# Patient Record
Sex: Male | Born: 2000
Health system: Southern US, Community
[De-identification: ages and names within clinical notes are randomized; demographics above are authoritative.]

## PROBLEM LIST (undated history)

## (undated) DIAGNOSIS — F909 Attention-deficit hyperactivity disorder, unspecified type: Secondary | ICD-10-CM

## (undated) HISTORY — PX: HERNIA REPAIR: SHX51

## (undated) HISTORY — DX: Attention-deficit hyperactivity disorder, unspecified type: F90.9

---
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
## 2021-03-23 ENCOUNTER — Encounter: Payer: Self-pay | Admitting: Emergency Medicine

## 2021-03-23 ENCOUNTER — Ambulatory Visit
Admission: EM | Admit: 2021-03-23 | Discharge: 2021-03-23 | Disposition: A | Discharge status: Home / Self Care | Payer: BC Managed Care – PPO | Attending: Emergency Medicine | Admitting: Emergency Medicine

## 2021-03-23 ENCOUNTER — Other Ambulatory Visit: Payer: Self-pay

## 2021-03-23 DIAGNOSIS — K529 Noninfective gastroenteritis and colitis, unspecified: Secondary | ICD-10-CM | POA: Diagnosis not present

## 2021-03-23 MED ORDER — DICYCLOMINE HCL 20 MG PO TABS: 20.0000 mg | ORAL_TABLET | Freq: Two times a day (BID) | ORAL | 0 refills | Status: DC

## 2021-03-23 MED ORDER — ONDANSETRON 8 MG PO TBDP: 8.0000 mg | ORAL_TABLET | Freq: Three times a day (TID) | ORAL | 0 refills | Status: DC | PRN

## 2021-03-23 NOTE — ED Triage Notes (Signed)
Pt c/o vomiting, diarrhea, and abdominal pain. Started this morning. He states he has not been feeling well all week but symptoms started this morning. He has not vomited since 12 today.

## 2021-03-23 NOTE — Discharge Instructions (Signed)
Take the Zofran every 8 hours as needed for nausea and vomiting.  They are an oral disintegrating tablet and you can place them on her under your tongue and then will be absorbed.  Use the Bentyl (dicyclomine) every 6 hours as needed for abdominal cramping.  Follow a clear liquid diet for the next 6 to 12 hours.  Clear liquids consist of broth, ginger ale, water, Pedialyte, and Jell-O.  After 6 to 12 hours, if you are tolerating clear liquids, you can advance to bland foods such as bananas, rice, applesauce, and toast.  If you tolerate bland foods you can continue to advance your diet as you see fit.  If you develop a fever over 100.5, increased abdominal pain, bloody vomit, or bloody stool return for reevaluation or go to the ER.  

## 2021-03-23 NOTE — ED Provider Notes (Signed)
[facility]    CSN: [id number] Arrival date & time: 03/23/21  1532      History   Chief Complaint Chief Complaint  Patient presents with   Emesis    HPI Eduardo Ball is a 20 y.o. male.   HPI  20 year old male here for evaluation of GI complaints.  Patient reports that he developed abdominal cramping, nausea, vomiting, and diarrhea this morning on his way to work.  He is about 5 episodes of emesis without any blood in it and 2 episodes of diarrhea, also with no blood in the stool.  He denies any fever.  Patient denies exposure to anyone with similar symptoms or eating anything that tasted bad or different.  Patient is able to keep down fluids  Past Medical History:  Diagnosis Date   ADHD (attention deficit hyperactivity disorder)     Patient Active Problem List   Diagnosis Date Noted   Undescended left testicle 04/29/2018   Attention deficit hyperactivity disorder (ADHD) 04/24/2018   Attention or concentration deficit 10/17/2017    Past Surgical History:  Procedure Laterality Date   HERNIA REPAIR         Home Medications    Prior to Admission medications   Medication Sig Start Date End Date Taking? Authorizing Provider  dicyclomine (BENTYL) 20 MG tablet Take 1 tablet (20 mg total) by mouth 2 (two) times daily. 03/23/21  Yes Becky Augusta, NP  ondansetron (ZOFRAN ODT) 8 MG disintegrating tablet Take 1 tablet (8 mg total) by mouth every 8 (eight) hours as needed for nausea or vomiting. 03/23/21  Yes Becky Augusta, NP    Family History No family history on file.  Social History Social History   Tobacco Use   Smoking status: Never   Smokeless tobacco: Former    Types: Snuff  Vaping Use   Vaping Use: Every day  Substance Use Topics   Alcohol use: Not Currently    Alcohol/week: 0.0 - 4.0 standard drinks   Drug use: No     Allergies   Patient has no known allergies.   Review of Systems Review of Systems  Constitutional:  Negative for  activity change, appetite change and fever.  Gastrointestinal:  Positive for abdominal pain, diarrhea, nausea and vomiting. Negative for blood in stool.  Skin:  Negative for rash.  Hematological: Negative.   Psychiatric/Behavioral: Negative.      Physical Exam Triage Vital Signs ED Triage Vitals  Enc Vitals Group     BP 03/23/21 1658 122/83     Pulse Rate 03/23/21 1658 83     Resp 03/23/21 1658 18     Temp 03/23/21 1658 98.6 F (37 C)     Temp Source 03/23/21 1658 Oral     SpO2 03/23/21 1658 100 %     Weight 03/23/21 1656 167 lb 15.9 oz (76.2 kg)     Height 03/23/21 1656 5' 11.75" (1.822 m)     Head Circumference --      Peak Flow --      Pain Score 03/23/21 1655 5     Pain Loc --      Pain Edu? --      Excl. in GC? --    No data found.  Updated Vital Signs BP 122/83 (BP Location: Left Arm)   Pulse 83   Temp 98.6 F (37 C) (Oral)   Resp 18   Ht 5' 11.75" (1.822 m)   Wt 167 lb 15.9 oz (76.2 kg)  SpO2 100%   BMI 22.94 kg/m   Visual Acuity Right Eye Distance:   Left Eye Distance:   Bilateral Distance:    Right Eye Near:   Left Eye Near:    Bilateral Near:     Physical Exam Vitals and nursing note reviewed.  Constitutional:      General: He is not in acute distress.    Appearance: Normal appearance. He is normal weight. He is not ill-appearing.  HENT:     Head: Normocephalic.  Cardiovascular:     Rate and Rhythm: Normal rate and regular rhythm.     Pulses: Normal pulses.     Heart sounds: Normal heart sounds. No murmur heard.   No gallop.  Pulmonary:     Effort: Pulmonary effort is normal.     Breath sounds: Normal breath sounds. No wheezing, rhonchi or rales.  Abdominal:     General: Abdomen is flat. Bowel sounds are normal.     Palpations: Abdomen is soft.     Tenderness: There is no abdominal tenderness. There is no guarding or rebound.  Skin:    General: Skin is warm and dry.     Capillary Refill: Capillary refill takes less than 2 seconds.      Findings: No erythema or rash.  Neurological:     General: No focal deficit present.     Mental Status: He is alert and oriented to person, place, and time.  Psychiatric:        Mood and Affect: Mood normal.        Behavior: Behavior normal.        Thought Content: Thought content normal.        Judgment: Judgment normal.     UC Treatments / Results  Labs (all labs ordered are listed, but only abnormal results are displayed) Labs Reviewed - No data to display  EKG   Radiology No results found.  Procedures Procedures (including critical care time)  Medications Ordered in UC Medications - No data to display  Initial Impression / Assessment and Plan / UC Course  I have reviewed the triage vital signs and the nursing notes.  Pertinent labs & imaging results that were available during my care of the patient were reviewed by me and considered in my medical decision making (see chart for details).  Patient is a nontoxic-appearing 21 year old male here for evaluation of abdominal complaints as outlined in HPI above.  Patient's physical exam reveals a benign cardiopulmonary exam with clear lung sounds in all fields.  Abdomen is soft, flat, nontender, with positive bowel sounds in all 4 quadrants.  Patient states that he has been eating mostly chicken and rice and attempt to bulk up but denies eating any undercooked chicken or any other questionable foods here recently and he has not been exposed anyone with similar symptoms.  Suspect patient has a viral gastroenteritis and will treat accordingly with Zofran to help with nausea, Bentyl tablet cramping, and have patient follow a clear liquid diet for next 12 to 24 hours followed by a bland diet if his symptoms have improved.  Patient can then advance his diet as tolerated beyond that.   Final Clinical Impressions(s) / UC Diagnoses   Final diagnoses:  Gastroenteritis     Discharge Instructions      Take the Zofran every 8 hours as  needed for nausea and vomiting.  They are an oral disintegrating tablet and you can place them on her under your tongue and then will  be absorbed.  Use the Bentyl (dicyclomine) every 6 hours as needed for abdominal cramping.  Follow a clear liquid diet for the next 6 to 12 hours.  Clear liquids consist of broth, ginger ale, water, Pedialyte, and Jell-O.  After 6 to 12 hours, if you are tolerating clear liquids, you can advance to bland foods such as bananas, rice, applesauce, and toast.  If you tolerate bland foods you can continue to advance your diet as you see fit.  If you develop a fever over 100.5, increased abdominal pain, bloody vomit, or bloody stool return for reevaluation or go to the ER.      ED Prescriptions     Medication Sig Dispense Auth. Provider   ondansetron (ZOFRAN ODT) 8 MG disintegrating tablet Take 1 tablet (8 mg total) by mouth every 8 (eight) hours as needed for nausea or vomiting. 20 tablet Becky Augusta, NP   dicyclomine (BENTYL) 20 MG tablet Take 1 tablet (20 mg total) by mouth 2 (two) times daily. 20 tablet Becky Augusta, NP      PDMP not reviewed this encounter.   Becky Augusta, NP 03/23/21 1735

## 2021-11-13 ENCOUNTER — Other Ambulatory Visit: Payer: Self-pay

## 2021-11-13 MED ORDER — OXYCODONE HCL 5 MG PO TABS
ORAL_TABLET | ORAL | 0 refills | Status: DC
  Filled 2021-11-13: qty 9, 3d supply, fill #0

## 2021-11-14 ENCOUNTER — Other Ambulatory Visit: Payer: Self-pay

## 2021-12-03 ENCOUNTER — Other Ambulatory Visit: Payer: Self-pay

## 2022-06-05 ENCOUNTER — Emergency Department: Payer: No Typology Code available for payment source

## 2022-06-05 ENCOUNTER — Other Ambulatory Visit: Payer: Self-pay

## 2022-06-05 ENCOUNTER — Emergency Department
Admission: EM | Admit: 2022-06-05 | Discharge: 2022-06-05 | Disposition: A | Discharge status: Home / Self Care | Payer: No Typology Code available for payment source | Attending: Student in an Organized Health Care Education/Training Program | Admitting: Student in an Organized Health Care Education/Training Program

## 2022-06-05 DIAGNOSIS — R059 Cough, unspecified: Secondary | ICD-10-CM | POA: Diagnosis present

## 2022-06-05 DIAGNOSIS — Z1152 Encounter for screening for COVID-19: Secondary | ICD-10-CM | POA: Insufficient documentation

## 2022-06-05 DIAGNOSIS — J189 Pneumonia, unspecified organism: Secondary | ICD-10-CM

## 2022-06-05 DIAGNOSIS — J181 Lobar pneumonia, unspecified organism: Secondary | ICD-10-CM | POA: Diagnosis not present

## 2022-06-05 LAB — RESP PANEL BY RT-PCR (RSV, FLU A&B, COVID)  RVPGX2
Influenza A by PCR: NEGATIVE
Influenza B by PCR: NEGATIVE
Resp Syncytial Virus by PCR: NEGATIVE
SARS Coronavirus 2 by RT PCR: NEGATIVE

## 2022-06-05 MED ORDER — AMOXICILLIN-POT CLAVULANATE 875-125 MG PO TABS: 1.0000 | ORAL_TABLET | Freq: Two times a day (BID) | ORAL | 0 refills | Status: AC

## 2022-06-05 MED ORDER — DOXYCYCLINE MONOHYDRATE 100 MG PO TABS: 100.0000 mg | ORAL_TABLET | Freq: Two times a day (BID) | ORAL | 0 refills | Status: AC

## 2022-06-05 MED ORDER — BENZONATATE 100 MG PO CAPS: 100.0000 mg | ORAL_CAPSULE | Freq: Three times a day (TID) | ORAL | 0 refills | Status: AC | PRN

## 2022-06-05 NOTE — ED Provider Notes (Signed)
[facility] Provider Note    Event Date/Time   First MD Initiated Contact with Patient 06/05/22 2046     (approximate)   History   Cough   HPI  Eduardo Ball is a 21 y.o. male  otherwise healthy who comes in for illness for 2-3 weeks. Was seen at UC 1.5 weeks ago and concern for pneumonia. He went on antibiotics and inhalers. Reviewed note from 12/7 placed on augmentin, tessalon pearls, inhaler. Reports it got better but hasn't gone away fully. Still having cough, sinus pressure, pain in R side chest. Pt denies any more fevers. NO risk factors for PE  No swelling in legs, no h/o blood clots, no long travel, no recent surgery.    NO iv drug use, no high risk sex.   Physical Exam   Triage Vital Signs: ED Triage Vitals [06/05/22 1836]  Enc Vitals Group     BP 101/83     Pulse Rate 100     Resp 18     Temp 99.3 F (37.4 C)     Temp Source Oral     SpO2 100 %     Weight 167 lb (75.8 kg)     Height 5\' 11"  (1.803 m)     Head Circumference      Peak Flow      Pain Score 8     Pain Loc      Pain Edu?      Excl. in GC?     Most recent vital signs: Vitals:   06/05/22 1836  BP: 101/83  Pulse: 100  Resp: 18  Temp: 99.3 F (37.4 C)  SpO2: 100%     General: Awake, no distress.  CV:  Good peripheral perfusion.  Resp:  Normal effort. No wheezing + cough + congestion Abd:  No distention. Soft and non tender Other:  No swelling in legs, no calf tenderness   ED Results / Procedures / Treatments   Labs (all labs ordered are listed, but only abnormal results are displayed) Labs Reviewed  RESP PANEL BY RT-PCR (RSV, FLU A&B, COVID)  [id number]    RADIOLOGY I have reviewed the xray personally and interpretted R middle Lobe PNA   PROCEDURES:  Critical Care performed: No  Procedures   MEDICATIONS ORDERED IN ED: Medications - No data to display   IMPRESSION / MDM / ASSESSMENT AND PLAN / ED COURSE  I reviewed the triage vital signs and  the nursing notes.   Patient's presentation is most consistent with acute, uncomplicated illness.   Differential: covid, flu, pna. Consider PE unable to perc out because of HR but otherwise no risk factors but given seems most ocncistent with PNA and rx failure due to not getting atypical coverage we discussed blood work/CT.  Pt seen in triage ext. Would need to wait for a room. Pt declined.  Preferred to trial new antibiotics and will reutrn if symptoms worsen.    Covid/Flu negative   The patient is on the cardiac monitor to evaluate for evidence of arrhythmia and/or significant heart rate changes.      FINAL CLINICAL IMPRESSION(S) / ED DIAGNOSES   Final diagnoses:  Community acquired pneumonia of right middle lobe of lung     Rx / DC Orders   ED Discharge Orders          Ordered    amoxicillin-clavulanate (AUGMENTIN) 875-125 MG tablet  2 times daily        06/05/22 2112  doxycycline (ADOXA) 100 MG tablet  2 times daily        06/05/22 2112    benzonatate (TESSALON PERLES) 100 MG capsule  3 times daily PRN        06/05/22 2112             Note:  This document was prepared using Dragon voice recognition software and may include unintentional dictation errors.   Concha Se, MD 06/05/22 2113

## 2022-06-05 NOTE — ED Provider Triage Note (Signed)
Emergency Medicine Provider Triage Evaluation Note  Eduardo Ball , a 21 y.o. male  was evaluated in triage.  Pt complains of right rib pain x2 days, worse with coughing. No fever. No SOB.  Reports that he has had a productive cough since last week.  Review of Systems  Positive: Cough, SOB, rib pain Negative: Fever  Physical Exam  There were no vitals taken for this visit. Gen:   Awake, no distress   Resp:  Normal effort  MSK:   Moves extremities without difficulty  Other:    Medical Decision Making  Medically screening exam initiated at 6:36 PM.  Appropriate orders placed.  SANTO ZAHRADNIK was informed that the remainder of the evaluation will be completed by another provider, this initial triage assessment does not replace that evaluation, and the importance of remaining in the ED until their evaluation is complete.     Keturah Shavers 06/05/22 6237

## 2022-06-05 NOTE — ED Triage Notes (Signed)
Patient reports was seen last week at [facility] for cough and right rib pain.  Was put on antibiotics and reports still not feeling better and pain in ribs worse.

## 2022-12-08 ENCOUNTER — Other Ambulatory Visit: Payer: Self-pay

## 2022-12-08 ENCOUNTER — Emergency Department: Payer: No Typology Code available for payment source

## 2022-12-08 ENCOUNTER — Emergency Department: Payer: No Typology Code available for payment source | Admitting: Certified Registered Nurse Anesthetist

## 2022-12-08 ENCOUNTER — Ambulatory Visit
Admission: EM | Admit: 2022-12-08 | Discharge: 2022-12-08 | Disposition: A | Discharge status: Home / Self Care | Payer: No Typology Code available for payment source | Attending: Emergency Medicine | Admitting: Emergency Medicine

## 2022-12-08 ENCOUNTER — Encounter
Admission: EM | Disposition: A | Discharge status: Home / Self Care | Payer: Self-pay | Source: Home / Self Care | Attending: Emergency Medicine

## 2022-12-08 DIAGNOSIS — F909 Attention-deficit hyperactivity disorder, unspecified type: Secondary | ICD-10-CM | POA: Insufficient documentation

## 2022-12-08 DIAGNOSIS — S82831A Other fracture of upper and lower end of right fibula, initial encounter for closed fracture: Secondary | ICD-10-CM | POA: Diagnosis present

## 2022-12-08 DIAGNOSIS — X501XXA Overexertion from prolonged static or awkward postures, initial encounter: Secondary | ICD-10-CM | POA: Insufficient documentation

## 2022-12-08 DIAGNOSIS — S93431A Sprain of tibiofibular ligament of right ankle, initial encounter: Secondary | ICD-10-CM | POA: Diagnosis not present

## 2022-12-08 DIAGNOSIS — S82851A Displaced trimalleolar fracture of right lower leg, initial encounter for closed fracture: Secondary | ICD-10-CM | POA: Diagnosis present

## 2022-12-08 HISTORY — PX: ORIF ANKLE FRACTURE: SHX5408

## 2022-12-08 LAB — BASIC METABOLIC PANEL
Anion gap: 9 (ref 5–15)
BUN: 11 mg/dL (ref 6–20)
CO2: 23 mmol/L (ref 22–32)
Calcium: 8.9 mg/dL (ref 8.9–10.3)
Chloride: 106 mmol/L (ref 98–111)
Creatinine, Ser: 0.76 mg/dL (ref 0.61–1.24)
GFR, Estimated: >60 mL/min (ref >60)
Glucose, Bld: 100 mg/dL — ABNORMAL HIGH (ref 70–99)
Potassium: 3.5 mmol/L (ref 3.5–5.1)
Sodium: 138 mmol/L (ref 135–145)

## 2022-12-08 LAB — CBC
HCT: 41.8 % (ref 39.0–52.0)
Hemoglobin: 14.9 g/dL (ref 13.0–17.0)
MCH: 30.5 pg (ref 26.0–34.0)
MCHC: 35.6 g/dL (ref 30.0–36.0)
MCV: 85.5 fL (ref 80.0–100.0)
Platelets: 230 10*3/uL (ref 150–400)
RBC: 4.89 MIL/uL (ref 4.22–5.81)
RDW: 12.6 % (ref 11.5–15.5)
WBC: 16.7 10*3/uL — ABNORMAL HIGH (ref 4.0–10.5)
nRBC: 0 % (ref 0.0–0.2)

## 2022-12-08 SURGERY — OPEN REDUCTION INTERNAL FIXATION (ORIF) ANKLE FRACTURE
Anesthesia: General | Site: Ankle | Laterality: Right

## 2022-12-08 MED ORDER — METOCLOPRAMIDE HCL 10 MG PO TABS: 5.0000 mg | ORAL_TABLET | Freq: Three times a day (TID) | ORAL | Status: DC | PRN

## 2022-12-08 MED ORDER — PROPOFOL 10 MG/ML IV BOLUS
INTRAVENOUS | Status: AC
  Filled 2022-12-08: qty 20

## 2022-12-08 MED ORDER — CEFAZOLIN SODIUM-DEXTROSE 2-4 GM/100ML-% IV SOLN
INTRAVENOUS | Status: AC
  Filled 2022-12-08: qty 100

## 2022-12-08 MED ORDER — OXYCODONE HCL 5 MG/5ML PO SOLN: 5.0000 mg | Freq: Once | ORAL | Status: DC | PRN

## 2022-12-08 MED ORDER — ROCURONIUM BROMIDE 100 MG/10ML IV SOLN
INTRAVENOUS | Status: DC | PRN
  Administered 2022-12-08 (×3): 20 mg via INTRAVENOUS
  Administered 2022-12-08: 60 mg via INTRAVENOUS
  Administered 2022-12-08 (×2): 20 mg via INTRAVENOUS

## 2022-12-08 MED ORDER — LIDOCAINE HCL (PF) 1 % IJ SOLN
10.0000 mL | Freq: Once | INTRAMUSCULAR | Status: AC
  Administered 2022-12-08: 10 mL
  Filled 2022-12-08: qty 10

## 2022-12-08 MED ORDER — ONDANSETRON HCL 4 MG/2ML IJ SOLN
INTRAMUSCULAR | Status: DC | PRN
  Administered 2022-12-08: 4 mg via INTRAVENOUS

## 2022-12-08 MED ORDER — PROPOFOL 1000 MG/100ML IV EMUL
INTRAVENOUS | Status: AC
  Filled 2022-12-08: qty 100

## 2022-12-08 MED ORDER — ONDANSETRON HCL 4 MG/2ML IJ SOLN: 4.0000 mg | Freq: Four times a day (QID) | INTRAMUSCULAR | Status: DC | PRN

## 2022-12-08 MED ORDER — CEFAZOLIN SODIUM-DEXTROSE 2-4 GM/100ML-% IV SOLN
2.0000 g | INTRAVENOUS | Status: AC
  Administered 2022-12-08: 2 g via INTRAVENOUS

## 2022-12-08 MED ORDER — DEXMEDETOMIDINE HCL IN NACL 80 MCG/20ML IV SOLN
INTRAVENOUS | Status: DC | PRN
  Administered 2022-12-08 (×4): 4 ug via INTRAVENOUS

## 2022-12-08 MED ORDER — FENTANYL CITRATE (PF) 100 MCG/2ML IJ SOLN
INTRAMUSCULAR | Status: AC
  Filled 2022-12-08: qty 2

## 2022-12-08 MED ORDER — ONDANSETRON HCL 4 MG/2ML IJ SOLN
INTRAMUSCULAR | Status: AC
  Filled 2022-12-08: qty 2

## 2022-12-08 MED ORDER — CHLORHEXIDINE GLUCONATE 4 % EX SOLN: 60.0000 mL | Freq: Once | CUTANEOUS | Status: DC

## 2022-12-08 MED ORDER — MORPHINE SULFATE (PF) 4 MG/ML IV SOLN
4.0000 mg | Freq: Once | INTRAVENOUS | Status: AC
  Administered 2022-12-08: 4 mg via INTRAVENOUS
  Filled 2022-12-08: qty 1

## 2022-12-08 MED ORDER — KETOROLAC TROMETHAMINE 30 MG/ML IJ SOLN
15.0000 mg | Freq: Once | INTRAMUSCULAR | Status: AC
  Administered 2022-12-08: 15 mg via INTRAVENOUS
  Filled 2022-12-08: qty 1

## 2022-12-08 MED ORDER — PROPOFOL 500 MG/50ML IV EMUL
INTRAVENOUS | Status: DC | PRN
  Administered 2022-12-08: 150 ug/kg/min via INTRAVENOUS

## 2022-12-08 MED ORDER — POVIDONE-IODINE 10 % EX SWAB
2.0000 | Freq: Once | CUTANEOUS | Status: DC
  Filled 2022-12-08: qty 4

## 2022-12-08 MED ORDER — FENTANYL CITRATE (PF) 100 MCG/2ML IJ SOLN: 25.0000 ug | INTRAMUSCULAR | Status: DC | PRN

## 2022-12-08 MED ORDER — ONDANSETRON HCL 4 MG/2ML IJ SOLN
4.0000 mg | INTRAMUSCULAR | Status: AC
  Administered 2022-12-08: 4 mg via INTRAVENOUS
  Filled 2022-12-08: qty 2

## 2022-12-08 MED ORDER — DEXMEDETOMIDINE HCL IN NACL 80 MCG/20ML IV SOLN
INTRAVENOUS | Status: AC
  Filled 2022-12-08: qty 20

## 2022-12-08 MED ORDER — ACETAMINOPHEN 10 MG/ML IV SOLN: 1000.0000 mg | Freq: Once | INTRAVENOUS | Status: DC | PRN

## 2022-12-08 MED ORDER — 0.9 % SODIUM CHLORIDE (POUR BTL) OPTIME
TOPICAL | Status: DC | PRN
  Administered 2022-12-08 (×2): 500 mL

## 2022-12-08 MED ORDER — LACTATED RINGERS IV SOLN: INTRAVENOUS | Status: DC | PRN

## 2022-12-08 MED ORDER — DEXAMETHASONE SODIUM PHOSPHATE 10 MG/ML IJ SOLN
INTRAMUSCULAR | Status: DC | PRN
  Administered 2022-12-08: 10 mg via INTRAVENOUS

## 2022-12-08 MED ORDER — OXYCODONE-ACETAMINOPHEN 5-325 MG PO TABS: 1.0000 | ORAL_TABLET | Freq: Four times a day (QID) | ORAL | 0 refills | Status: DC | PRN

## 2022-12-08 MED ORDER — ONDANSETRON HCL 4 MG/2ML IJ SOLN: 4.0000 mg | Freq: Once | INTRAMUSCULAR | Status: DC | PRN

## 2022-12-08 MED ORDER — ROCURONIUM BROMIDE 10 MG/ML (PF) SYRINGE
PREFILLED_SYRINGE | INTRAVENOUS | Status: AC
  Filled 2022-12-08: qty 10

## 2022-12-08 MED ORDER — BUPIVACAINE HCL (PF) 0.5 % IJ SOLN
INTRAMUSCULAR | Status: DC | PRN
  Administered 2022-12-08 (×2): 10 mL via PERINEURAL

## 2022-12-08 MED ORDER — HYDROMORPHONE HCL 1 MG/ML IJ SOLN
1.0000 mg | INTRAMUSCULAR | Status: AC
  Administered 2022-12-08: 1 mg via INTRAVENOUS
  Filled 2022-12-08: qty 1

## 2022-12-08 MED ORDER — ONDANSETRON HCL 4 MG PO TABS: 4.0000 mg | ORAL_TABLET | Freq: Four times a day (QID) | ORAL | Status: DC | PRN

## 2022-12-08 MED ORDER — MIDAZOLAM HCL 2 MG/2ML IJ SOLN
2.0000 mg | Freq: Once | INTRAMUSCULAR | Status: AC
  Administered 2022-12-08: 2 mg via INTRAVENOUS

## 2022-12-08 MED ORDER — DEXAMETHASONE SODIUM PHOSPHATE 10 MG/ML IJ SOLN
INTRAMUSCULAR | Status: AC
  Filled 2022-12-08: qty 1

## 2022-12-08 MED ORDER — SUGAMMADEX SODIUM 200 MG/2ML IV SOLN
INTRAVENOUS | Status: DC | PRN
  Administered 2022-12-08: 200 mg via INTRAVENOUS

## 2022-12-08 MED ORDER — PROPOFOL 10 MG/ML IV BOLUS
INTRAVENOUS | Status: DC | PRN
  Administered 2022-12-08: 200 mg via INTRAVENOUS

## 2022-12-08 MED ORDER — LIDOCAINE HCL (CARDIAC) PF 100 MG/5ML IV SOSY
PREFILLED_SYRINGE | INTRAVENOUS | Status: DC | PRN
  Administered 2022-12-08: 100 mg via INTRAVENOUS

## 2022-12-08 MED ORDER — BUPIVACAINE HCL (PF) 0.5 % IJ SOLN
INTRAMUSCULAR | Status: AC
  Filled 2022-12-08: qty 20

## 2022-12-08 MED ORDER — MIDAZOLAM HCL 2 MG/2ML IJ SOLN
INTRAMUSCULAR | Status: AC
  Filled 2022-12-08: qty 2

## 2022-12-08 MED ORDER — BUPIVACAINE LIPOSOME 1.3 % IJ SUSP
INTRAMUSCULAR | Status: AC
  Filled 2022-12-08: qty 20

## 2022-12-08 MED ORDER — BUPIVACAINE LIPOSOME 1.3 % IJ SUSP
20.0000 mL | Freq: Once | INTRAMUSCULAR | Status: AC
  Administered 2022-12-08: 266 mg

## 2022-12-08 MED ORDER — FENTANYL CITRATE (PF) 100 MCG/2ML IJ SOLN
INTRAMUSCULAR | Status: DC | PRN
  Administered 2022-12-08: 100 ug via INTRAVENOUS
  Administered 2022-12-08: 50 ug via INTRAVENOUS

## 2022-12-08 MED ORDER — BUPIVACAINE LIPOSOME 1.3 % IJ SUSP
INTRAMUSCULAR | Status: DC | PRN
  Administered 2022-12-08 (×2): 10 mL via PERINEURAL

## 2022-12-08 MED ORDER — OXYCODONE HCL 5 MG PO TABS: 5.0000 mg | ORAL_TABLET | Freq: Once | ORAL | Status: DC | PRN

## 2022-12-08 MED ORDER — METOCLOPRAMIDE HCL 5 MG/ML IJ SOLN: 5.0000 mg | Freq: Three times a day (TID) | INTRAMUSCULAR | Status: DC | PRN

## 2022-12-08 MED ORDER — KETOROLAC TROMETHAMINE 30 MG/ML IJ SOLN
INTRAMUSCULAR | Status: DC | PRN
  Administered 2022-12-08: 30 mg via INTRAVENOUS

## 2022-12-08 SURGICAL SUPPLY — 69 items
BIT DRILL CANN LONG 4.6X220 (DRILL) IMPLANT
BLADE SURG 15 STRL LF DISP TIS (BLADE) IMPLANT
BLADE SURG 15 STRL SS (BLADE)
BNDG CMPR 5X4 CHSV STRCH STRL (GAUZE/BANDAGES/DRESSINGS) ×1
BNDG CMPR 75X21 PLY HI ABS (MISCELLANEOUS) ×1
BNDG CMPR STD VLCR NS LF 5.8X4 (GAUZE/BANDAGES/DRESSINGS) ×1
BNDG COHESIVE 4X5 TAN STRL LF (GAUZE/BANDAGES/DRESSINGS) ×1 IMPLANT
BNDG ELASTIC 4X5.8 VLCR NS LF (GAUZE/BANDAGES/DRESSINGS) ×1 IMPLANT
BNDG ESMARCH 4 X 12 STRL LF (GAUZE/BANDAGES/DRESSINGS) ×1
BNDG ESMARCH 4X12 STRL LF (GAUZE/BANDAGES/DRESSINGS) ×1 IMPLANT
BNDG GAUZE DERMACEA FLUFF 4 (GAUZE/BANDAGES/DRESSINGS) ×1 IMPLANT
BNDG GZE 12X3 1 PLY HI ABS (GAUZE/BANDAGES/DRESSINGS) ×2
BNDG GZE DERMACEA 4 6PLY (GAUZE/BANDAGES/DRESSINGS) ×2
BNDG STRETCH GAUZE 3IN X12FT (GAUZE/BANDAGES/DRESSINGS) ×1 IMPLANT
CUFF TOURN SGL QUICK 18X4 (TOURNIQUET CUFF) IMPLANT
CUFF TOURN SGL QUICK 24 (TOURNIQUET CUFF)
CUFF TRNQT CYL 24X4X16.5-23 (TOURNIQUET CUFF) IMPLANT
DRAPE C-ARM XRAY 36X54 (DRAPES) ×1 IMPLANT
DRAPE C-ARMOR (DRAPES) ×1 IMPLANT
DRILL CANN LONG 4.6X220 (DRILL) ×1
DRSG OPSITE POSTOP 4X6 (GAUZE/BANDAGES/DRESSINGS) IMPLANT
DURAPREP 26ML APPLICATOR (WOUND CARE) ×1 IMPLANT
ELECT REM PT RETURN 9FT ADLT (ELECTROSURGICAL) ×1
ELECTRODE REM PT RTRN 9FT ADLT (ELECTROSURGICAL) ×1 IMPLANT
FIXATION ZIPTIGHT ANKLE SNDSMS (Ankle) IMPLANT
GAUZE SPONGE 4X4 12PLY STRL (GAUZE/BANDAGES/DRESSINGS) ×1 IMPLANT
GAUZE STRETCH 2X75IN STRL (MISCELLANEOUS) ×1 IMPLANT
GAUZE XEROFORM 1X8 LF (GAUZE/BANDAGES/DRESSINGS) ×1 IMPLANT
GLOVE BIO SURGEON STRL SZ7.5 (GLOVE) ×1 IMPLANT
GLOVE INDICATOR 8.0 STRL GRN (GLOVE) ×1 IMPLANT
GOWN STRL REUS W/ TWL XL LVL3 (GOWN DISPOSABLE) ×3 IMPLANT
GOWN STRL REUS W/TWL XL LVL3 (GOWN DISPOSABLE) ×3
K-WIRE SMOOTH 1.6X150MM (WIRE) ×1
K-WIRE SMOOTH TROCAR 2.0X150 (WIRE) ×1
KIT TURNOVER KIT A (KITS) ×1 IMPLANT
KWIRE SMOOTH 1.6X150MM (WIRE) IMPLANT
KWIRE SMOOTH TROCAR 2.0X150 (WIRE) IMPLANT
LABEL OR SOLS (LABEL) ×1 IMPLANT
MANIFOLD NEPTUNE II (INSTRUMENTS) ×1 IMPLANT
NDL HYPO 22X1.5 SAFETY MO (MISCELLANEOUS) ×1 IMPLANT
NEEDLE HYPO 22X1.5 SAFETY MO (MISCELLANEOUS) ×1 IMPLANT
NS IRRIG 500ML POUR BTL (IV SOLUTION) ×1 IMPLANT
PACK EXTREMITY ARMC (MISCELLANEOUS) ×1 IMPLANT
PAD ABD DERMACEA PRESS 5X9 (GAUZE/BANDAGES/DRESSINGS) IMPLANT
PAD PREP OB/GYN DISP 24X41 (PERSONAL CARE ITEMS) ×1 IMPLANT
PLATE MEDIAL MALLEOLUS 2H RT (Plate) IMPLANT
PLATE TIB 5H TI RT DIST (Plate) IMPLANT
SCREW 3.5X26 NONLOCKING (Screw) IMPLANT
SCREW LOCK PLATE R3 3.5X16 (Screw) IMPLANT
SCREW LOCK PLATE R3 3.5X30 (Screw) IMPLANT
SCREW RE3CON NL 3.5X30 (Screw) IMPLANT
SPLINT CAST 1 STEP 4X30 (MISCELLANEOUS) ×1 IMPLANT
SPLINT PLASTER CAST FAST 5X30 (CAST SUPPLIES) ×1 IMPLANT
SPONGE T-LAP 18X18 ~~LOC~~+RFID (SPONGE) ×1 IMPLANT
STAPLER SKIN PROX 35W (STAPLE) ×1 IMPLANT
STOCKINETTE M/LG 89821 (MISCELLANEOUS) ×1 IMPLANT
STRAP SAFETY 5IN WIDE (MISCELLANEOUS) ×1 IMPLANT
STRIP CLOSURE SKIN 1/2X4 (GAUZE/BANDAGES/DRESSINGS) IMPLANT
SUT VIC AB 2-0 CT1 27 (SUTURE) ×1
SUT VIC AB 2-0 CT1 TAPERPNT 27 (SUTURE) ×1 IMPLANT
SUT VIC AB 3-0 SH 27 (SUTURE) ×1
SUT VIC AB 3-0 SH 27X BRD (SUTURE) ×1 IMPLANT
SWABSTK COMLB BENZOIN TINCTURE (MISCELLANEOUS) IMPLANT
SYR 10ML LL (SYRINGE) ×1 IMPLANT
SYR 50ML LL SCALE MARK (SYRINGE) ×1 IMPLANT
TRAP FLUID SMOKE EVACUATOR (MISCELLANEOUS) ×1 IMPLANT
WATER STERILE IRR 500ML POUR (IV SOLUTION) ×1 IMPLANT
WIRE OLIVE GUIDE TOWER (WIRE) IMPLANT
ZIPTIGHT ANKLE SYNODESMOSS FIX (Ankle) ×1 IMPLANT
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
## 2022-12-08 NOTE — H&P (Signed)
Eduardo Ball is an 22 y.o. male.   Chief Complaint:  <principal problem not specified>   HPI: 22 year old sustained a right ankle injury yesterday.  Jumped down and landed twisted his ankle.  Immediate injury was noted.  Brought to the ER and x-rays and CT scan show trimalleolar ankle fracture with some posterior and lateral displacement.  He has been n.p.o. since yesterday evening.  Past Medical History:  Diagnosis Date   ADHD (attention deficit hyperactivity disorder)     Past Surgical History:  Procedure Laterality Date   HERNIA REPAIR      History reviewed. No pertinent family history. Social History:  reports that he has never smoked. He has quit using smokeless tobacco.  His smokeless tobacco use included snuff. He reports that he does not currently use alcohol. He reports that he does not use drugs.  Allergies: No Known Allergies  ROS  (Not in a hospital admission)   Physical Exam: General: Alert and oriented.  No apparent distress. Vascular:  Left foot:Dorsalis Pedis:  present Posterior Tibial:  present  Right foot:  Right ankle with posterior splint.  Brisk cap fill time. Neuro: Sensations intact to the foot. Derm: Not evaluated patient is in a splint. Ortho/MS: Patient is splinted at this time.  Guarded.  Demonstrates range of motion of the toes.  Results for orders placed or performed during the hospital encounter of 12/08/22 (from the past 48 hour(s))  Basic metabolic panel     Status: Abnormal   Collection Time: 12/08/22  7:46 AM  Result Value Ref Range   Sodium 138 135 - 145 mmol/L   Potassium 3.5 3.5 - 5.1 mmol/L   Chloride 106 98 - 111 mmol/L   CO2 23 22 - 32 mmol/L   Glucose, Bld 100 (H) 70 - 99 mg/dL    Comment: Glucose reference range applies only to samples taken after fasting for at least 8 hours.   BUN 11 6 - 20 mg/dL   Creatinine, Ser 1.61 0.61 - 1.24 mg/dL   Calcium 8.9 8.9 - 09.6 mg/dL   GFR, Estimated >04 >54 mL/min    Comment:  (NOTE) Calculated using the CKD-EPI Creatinine Equation (2021)    Anion gap 9 5 - 15    Comment: Performed at [facility], [address] [place], [place] [address]  CBC     Status: Abnormal   Collection Time: 12/08/22  7:46 AM  Result Value Ref Range   WBC 16.7 (H) 4.0 - 10.5 K/uL   RBC 4.89 4.22 - 5.81 MIL/uL   Hemoglobin 14.9 13.0 - 17.0 g/dL   HCT 91.4 78.2 - 95.6 %   MCV 85.5 80.0 - 100.0 fL   MCH 30.5 26.0 - 34.0 pg   MCHC 35.6 30.0 - 36.0 g/dL   RDW 21.3 08.6 - 57.8 %   Platelets 230 150 - 400 K/uL   nRBC 0.0 0.0 - 0.2 %    Comment: Performed at [facility], [address], [place], [place] [address]   CT Ankle Right Wo Contrast  Result Date: 12/08/2022 CLINICAL DATA:  Right ankle fracture. EXAM: CT OF THE RIGHT ANKLE WITHOUT CONTRAST TECHNIQUE: Multidetector CT imaging of the right ankle was performed according to the standard protocol. Multiplanar CT image reconstructions were also generated. RADIATION DOSE REDUCTION: This exam was performed according to the departmental dose-optimization program which includes automated exposure control, adjustment of the mA and/or kV according to patient size and/or use of iterative reconstruction technique. COMPARISON:  Right tibia fibula series today. FINDINGS: Bones/Joint/Cartilage There is widening of the distal tibiofibular interval consistent with a syndesmotic tear. Normal bone mineralization. There is acute closed trimalleolar fracture. This consists of a tiny nondisplaced chip fracture of the lateral malleolar tip, a transverse medial malleolar fracture at the level of the talar dome, and a longitudinal intra-articular fracture of the posterior malleolus, the latter with up to 6 mm of dorsal distraction of the fragment. There are a few tiny comminution fragments in between the medial malleolar fracture margins, several scattered comminution fragments in between the posterior malleolar fracture margins, with the lateral  malleolus, talar dome and medial malleolar fragment together translated 7 mm laterally relative to the tibia. On sagittal images there is a mild posterior displacement of the talus and foot relative to the distal tibia with impaction of the posterior tibial fracture margin on the talar dome and widening of the anterior tibiotalar joint space. There is evidence of tibiotalar joint hemarthrosis as well. The talus is intact with talar dome contour is preserved. The subtalar and talonavicular joints are intact. There are no further fractures.  Arthritic changes are not seen. Ligaments Suboptimally assessed by CT. Muscles and Tendons There is normal muscle bulk in the distal foreleg foot. No intramuscular hematoma is seen. Area tendons are also not well evaluated with CT but they are grossly intact as well as can be seen, including the Achilles tendon. Soft tissues Moderate diffuse edema. No space-occupying hematoma. No soft tissue gas which would suspect open injury. IMPRESSION: 1. Acute closed trimalleolar fracture with mild posterior displacement and lateral translation of the talus and foot relative to the distal tibia, and impaction of the posterior tibial fracture margin on the talar dome. 2. Widening of the distal tibiofibular interval consistent with a syndesmotic tear. 3. Tibiotalar joint hemarthrosis. 4. Diffuse soft tissue edema. No space-occupying hematoma. No soft tissue gas which would suspect open injury. Electronically Signed   By: Almira Bar M.D.   On: 12/08/2022 06:06   DG Tibia/Fibula Right  Result Date: 12/08/2022 CLINICAL DATA:  Lower leg injury with pain and swelling, initial encounter EXAM: RIGHT TIBIA AND FIBULA - 2 VIEW COMPARISON:  None Available. FINDINGS: Mildly displaced fracture of the proximal fibular shaft is noted. Additionally there is a transverse fracture through the medial malleolus. Soft tissue swelling is noted distally. Posterior malleolar fracture is also seen. IMPRESSION:  Fractures through the medial and posterior malleoli with associated proximal fibular shaft fracture. Electronically Signed   By: Alcide Clever M.D.   On: 12/08/2022 02:19    Blood pressure 116/63, pulse 84, temperature 98.3 F (36.8 C), temperature source Oral, resp. rate 15, height 6\' 1"  (1.854 m), weight 81.6 kg, SpO2 97 %.  Assessment/Plan Displaced trimalleolar ankle fracture right ankle  Plan for ORIF today.  I discussed the conservative and surgical options.  I discussed the displacement of the fracture definitely needs reduction especially given his age.  We discussed the risk benefits alternatives and complications associated with surgery.  Consent has been given.  Patient understands the risks associated with surgery.  He understands the need for complete nonweightbearing for 6 weeks or longer postoperatively.  Gwyneth Revels A 12/08/2022, 10:29 AM

## 2022-12-08 NOTE — Discharge Instructions (Addendum)
[facility]  POST OPERATIVE INSTRUCTIONS FOR DR. Ether Griffins AND DR. BAKER [facility] CLINIC PODIATRY DEPARTMENT   Take your medication as prescribed.  Pain medication should be taken only as needed.  Keep the dressing clean, dry and intact.  Keep your foot elevated above the heart level for the first 48 hours.  We have instructed you to be non-weight bearing.  Always wear your post-op shoe when walking.  Always use your crutches if you are to be non-weight bearing.  Do not take a shower. Baths are permissible as long as the foot is kept out of the water.   Every hour you are awake:  Bend your knee 15 times.  Call [facility] [phone]) if any of the following problems occur: You develop a temperature or fever. The bandage becomes saturated with blood. Medication does not stop your pain. Injury of the foot occurs. Any symptoms of infection including redness, odor, or red streaks running from wound.   AMBULATORY SURGERY  DISCHARGE INSTRUCTIONS   The drugs that you were given will stay in your system until tomorrow so for the next 24 hours you should not:  Drive an automobile Make any legal decisions Drink any alcoholic beverage   You may resume regular meals tomorrow.  Today it is better to start with liquids and gradually work up to solid foods.  You may eat anything you prefer, but it is better to start with liquids, then soup and crackers, and gradually work up to solid foods.   Please notify your doctor immediately if you have any unusual bleeding, trouble breathing, redness and pain at the surgery site, drainage, fever, or pain not relieved by medication.    Your post-operative visit with Dr.                                       is: Date:                        Time:    Please call to schedule your post-operative visit.  Additional Instructions: PLEASE KEEP GREEN/TEAL BRACELET ON FOR 4 DAYS

## 2022-12-08 NOTE — Anesthesia Procedure Notes (Signed)
Procedure Name: Intubation Date/Time: 12/08/2022 12:26 PM  Performed by: Joanette Gula, Dasja Brase, CRNAPre-anesthesia Checklist: Patient identified, Emergency Drugs available, Suction available and Patient being monitored Patient Re-evaluated:Patient Re-evaluated prior to induction Oxygen Delivery Method: Circle system utilized Preoxygenation: Pre-oxygenation with 100% oxygen Induction Type: IV induction Ventilation: Mask ventilation without difficulty Laryngoscope Size: McGraph and 4 Grade View: Grade I Tube type: Oral Tube size: 7.0 mm Number of attempts: 1 Airway Equipment and Method: Stylet Placement Confirmation: ETT inserted through vocal cords under direct vision, positive ETCO2 and breath sounds checked- equal and bilateral Secured at: 21 cm Tube secured with: Tape Dental Injury: Teeth and Oropharynx as per pre-operative assessment

## 2022-12-08 NOTE — ED Provider Notes (Signed)
[facility] Provider Note    Event Date/Time   First MD Initiated Contact with Patient 12/08/22 [phone]     (approximate)   History   Leg Injury (Right ankle)   HPI Eduardo Ball is a 22 y.o. male with no chronic medical issues and who presents for evaluation of pain in his right leg and ankle.  [place] to get away from them so he jumped off the morning onto the boat and landed badly on his right leg.  At the time he thought maybe he just sprained his ankle but he has pain mostly in the ankle but all throughout the lower leg but not extending above nor including his right knee.  He started to get concerned he might of broken it.  There is some swelling most notable around the distal part of the lower leg and ankle but some swelling of little bit higher.  He has some numbness in his foot but he is able to move it and wiggle his toes.  No other injuries.     Physical Exam   Triage Vital Signs: ED Triage Vitals [12/08/22 0142]  Enc Vitals Group     BP 111/86     Pulse Rate 85     Resp 16     Temp 98 F (36.7 C)     Temp Source Oral     SpO2 98 %     Weight 83.9 kg (185 lb)     Height 1.854 m (6\' 1" )     Head Circumference      Peak Flow      Pain Score 8     Pain Loc      Pain Edu?      Excl. in GC?     Most recent vital signs: Vitals:   12/08/22 0507 12/08/22 0553  BP: 118/63 116/63  Pulse: 80 84  Resp:  15  Temp: 98.3 F (36.8 C)   SpO2: 98% 97%    General: Awake, no distress.  CV:  Good peripheral perfusion.  Palpable distal pulse. Resp:  Normal effort. Speaking easily and comfortably, no accessory muscle usage nor intercostal retractions.   Abd:  No distention.  Other:  Swelling and some erythema around his right foot and ankle that extends partially up the leg.  He also has tenderness to palpation of that area but also the proximal right lower leg.  No pain or tenderness in the right knee with flexion  extension and no pain or tenderness with manipulation of the right femur.   ED Results / Procedures / Treatments   Labs (all labs ordered are listed, but only abnormal results are displayed) Labs Reviewed  BASIC METABOLIC PANEL - Abnormal; Notable for the following components:      Result Value   Glucose, Bld 100 (*)    All other components within normal limits  CBC      RADIOLOGY I viewed and interpreted the patient's x-rays.  He has obvious fractures of the proximal right fibula and in and around his ankle.  Radiology commented about fractures to the medial and posterior malleoli.  There is also question of subluxation of the ankle.  See hospital course for details about CT ankle.   PROCEDURES:  Critical Care performed: No  .Ortho Injury Treatment  Date/Time: 12/08/2022 3:30 AM  Performed by: Loleta Rose, MD Authorized by: Loleta Rose, MD   Consent:    Consent  obtained:  Verbal   Consent given by:  Patient   Risks discussed:  Fracture and stiffness   Alternatives discussed:  No treatmentInjury location: ankle Location details: right ankle Injury type: fracture Fracture type: trimalleolar Pre-procedure neurovascular assessment: neurovascularly intact Pre-procedure distal perfusion: normal Pre-procedure neurological function: normal Pre-procedure range of motion: reduced Anesthesia: hematoma block  Anesthesia: Local anesthesia used: yes Local Anesthetic: lidocaine 1% without epinephrine Anesthetic total: 10 mL  Patient sedated: NoManipulation performed: yes Immobilization: splint Splint type: short leg and ankle stirrup Splint Applied by: ED Provider and ED Tech Supplies used: Ortho-Glass Post-procedure neurovascular assessment: post-procedure neurovascularly intact Post-procedure distal perfusion: normal Post-procedure neurological function: normal Post-procedure range of motion: unchanged Comments: Reduction of subluxation vs mild dislocation of the  foot relative to the distal tibia was performed during the splint placement.  Based on the subsequent imaging, the reduction was not successful, and the subluxation/dislocation was addressed operatively by Dr. Ether Griffins.       IMPRESSION / MDM / ASSESSMENT AND PLAN / ED COURSE  I reviewed the triage vital signs and the nursing notes.                              Differential diagnosis includes, but is not limited to, fracture, dislocation, syndesmotic injury, contusion.  Patient's presentation is most consistent with acute complicated illness / injury requiring diagnostic workup.  Labs/studies ordered: Right tib-fib x-ray, CT ankle right, BMP, CBC  Interventions/Medications given:  Medications  lidocaine (PF) (XYLOCAINE) 1 % injection 10 mL (10 mLs Other Given 12/08/22 0415)  ondansetron (ZOFRAN) injection 4 mg (4 mg Intravenous Given 12/08/22 0416)  HYDROmorphone (DILAUDID) injection 1 mg (1 mg Intravenous Given 12/08/22 0417)  ketorolac (TORADOL) 30 MG/ML injection 15 mg (15 mg Intravenous Given 12/08/22 0359)  morphine (PF) 4 MG/ML injection 4 mg (4 mg Intravenous Given 12/08/22 0726)    (Note:  hospital course my include additional interventions and/or labs/studies not listed above.)   Multiple fractures evident on right tib-fib x-rays.  I will consult with Dr. Allena Katz first before giving the patient any medication (subsequently ordered meds as listed above to help with attempt at reduction of subluxation).  Patient has been n.p.o. for hours already and will remain so in case he requires surgery.  Consulting orthopedics.     Clinical Course as of 12/08/22 0827  Sat Dec 08, 2022  0343 Consulted by phone with Dr. Signa Kell.  He reviewed the images.  He advises that this is a surgical injury but the patient can follow-up next week in clinic rather than being an emergent issue.  He recommends a posterior short leg splint.  However he is concerned about the possibility of slight ankle  subluxation or dislocation.  He recommends a closed reduction to try to line up the medial malleolus better than it is currently.  He recommends a CT of the ankle after reduction and splint placement.  I will discuss this with the patient and his father shortly. [CF]  1610 CT Ankle Right Wo Contrast CT ankle reviewed and interpreted by me.  I see multiple areas of fracture and widening of the joint.  Radiologist confirmed probable syndesmotic injury, trimalar fracture, etc.  I consulted again by phone with Dr. Allena Katz.  He is checking the CT scan and will call me back about appropriate disposition. [CF]  0706 Consulted again by phone with Dr. Allena Katz.  He confirmed that this is a surgical issue  although he said that he is concerned that based on the extent of the trimalleolar fracture, he is not comfortable and does not think that he would be the best provider to do the surgery.  He recommended that I call podiatry and get their take on the situation.  He feels that if podiatry is also not comfortable, the patient can be discharged for outpatient follow-up at another facility.  I am paging Dr. Ether Griffins with podiatry to discuss the case. [CF]  4098 Consulted by phone with Dr. Ether Griffins with podiatry.  He will review the images and call me back. [CF]  [id number] I consulted with Dr. Ether Griffins and he said that he would admit the patient either way.  If the patient has been n.p.o. then he will work him into the OR schedule today.  I verified with the patient that he is n.p.o. and he did not have dinner last night.  I ordered another dose of morphine 4 mg IV.  I communicated with Dr. Ether Griffins and he will proceed with admission and OR plan.  Basic labs pending. [CF]  0822 DG Tibia/Fibula Right [CF]    Clinical Course User Index [CF] Loleta Rose, MD     FINAL CLINICAL IMPRESSION(S) / ED DIAGNOSES   Final diagnoses:  Closed right trimalleolar fracture, initial encounter  Closed fracture of proximal end of right fibula,  unspecified fracture morphology, initial encounter     Rx / DC Orders   ED Discharge Orders     None        Note:  This document was prepared using Dragon voice recognition software and may include unintentional dictation errors.   Loleta Rose, MD 12/08/22 1191    Loleta Rose, MD 12/11/22 1026

## 2022-12-08 NOTE — Op Note (Signed)
Operative note   Surgeon:Rhiannon Sassaman [employer] forces logistics/support/administrative officer: None    Preop diagnosis: 1.Right trimalleolar ankle fracture  2.  Syndesmosis rupture right ankle    Postop diagnosis: Same    Procedure: 1Open reduction with internal fixation right trimalleolar ankle fracture  2.  Repair syndesmosis right ankle    EBL: 30 mL    Anesthesia:regional and general a popliteal and saphenous block was placed preoperatively by anesthesia    Hemostasis: Thigh tourniquet inflated to 250 mmHg.  Initially for 78 minutes in the prone position.  Tourniquet was released for approximately 20 minutes.  Reinflated for an additional 85 minutes in the supine position    Specimen: None    Complications: None    Operative indications:Eduardo Ball is an 22 y.o. that presents today for surgical intervention.  The risks/benefits/alternatives/complications have been discussed and consent has been given.    Procedure:  Patient was brought into the OR and placed on the operating table in theprone position. After anesthesia was obtained theright lower extremity was prepped and draped in usual sterile fashion.  Attention was directed to the posterior aspect of the ankle where along the area just medial and lateral to the peroneal tendons and medial to the Achilles tendon a longitudinal incision was made along the posterior aspect of the ankle.  Sharp and blunt dissection carried down through the superficial and deeper fascial layers.  The peroneal tendons were reflected lateral and the flexor hallucis longus tendon was reflected medial.  Further dissection was taken down to the posterior tibia.  At this time the fracture was noted.  The medial portion of the fracture was released of the soft tissue.  Care was taken to keep the lateral soft tissue intact at the syndesmotic area.  Based on CT scanning there was noted to be a impingement fracture fragment within the posterior superior lateral aspect of the fracture.  This was removed  from the surgical field in toto.  Next reduction of the fracture was performed in good position as noted in all fluoroscopy planes.  This was stabilized temporarily with a K wire.  Next a posterior malleoli are plate from the [employer] screw set was placed with 3.5 millimeter screws.  A compression screw was placed in the distal portion of the plate and remaining screws were then fixated with locking screws.  Good alignment was noted in all planes.  This wound was then flushed with copious amounts of irrigation.  Layered closure was performed with a combination of 2-0 Vicryl for the deeper tissue, 3-0 Vicryl for the subcutaneous tissue and skin staples for the skin.  The tourniquet was released and the area was covered with a sterile honeycomb dressing.  Patient was then placed back in the supine position and resterile prep and drape were performed to the right lower extremity.  Attention was directed to the medial aspect of the ankle where a medial malleoli are incision was performed.  Sharp and blunt dissection carried down to the periosteum.  The fracture fragment of the distal medial malleolus was noted at this time.  Soft tissue was noted within the fracture site and this was clean.  Evaluation of the ankle joint did not reveal any obvious loose bodies of cartilage within the ankle joint itself.  At this time with the use of bone reduction forceps the fracture fragment was reduced.  Next a medial malleolus hook plate from the [employer]agon 28 set was placed and the prongs were placed in the distal  medial malleolus impacted into the bone.  Compression was placed through the plate with a 3.5 millimeter screw.  A 3.5 mm locking screw was then placed most proximal.  At this time there was still noted to be residual gapping of the medial gutter.  The medial gutter was evaluated without any soft tissue within the gutter itself.  Syndesmotic repair was then performed.  The large bone reduction clamp was placed from the  lateral malleolus to the medial malleolus.  A small stab incision was made just proximal to the tibiotalar joint line.  The guidewire for the zip tight syndesmotic Endobutton was placed from the lateral fibula into the medial malleolus.  Care was taken to avoid the medial mall of the plate.  At this time using standard technique for the zip tight the zip tight was placed across the tib-fib syndesmotic region and stabilized.  Good reduction was noted.  The bone reduction clamp was removed and good realignment of the tib-fib was noted with good overlap and reduction of the medial gapping of the medial malleolus.  All wounds were then flushed once again.  Closure was performed with a combination of 2-0 and 3-0 Vicryl for the deep and subcutaneous tissue and skin staples were placed for the superficial skin.  A bulky sterile dressing was applied.  Padded dressing was then applied over top of this and a posterior splint consisting of 10 sheets of plaster and a 4 x 30 prefab fiberglass splint was placed with Ace wrap and padding over top.    Patient tolerated the procedure and anesthesia well.  Was transported from the OR to the PACU with all vital signs stable and vascular status intact. To be discharged per routine protocol.  Will follow up in approximately 1 week in the outpatient clinic.

## 2022-12-08 NOTE — Anesthesia Procedure Notes (Signed)
Anesthesia Regional Block: Adductor canal block   Pre-Anesthetic Checklist: , timeout performed,  Correct Patient, Correct Site, Correct Laterality,  Correct Procedure, Correct Position, site marked,  Risks and benefits discussed,  Surgical consent,  Pre-op evaluation,  At surgeon's request and post-op pain management  Laterality: Lower and Right  Prep: chloraprep       Needles:  Injection technique: Single-shot  Needle Type: Echogenic Needle     Needle Length: 9cm  Needle Gauge: 21     Additional Needles:   Procedures:,,,, ultrasound used (permanent image in chart),,    Narrative:  Injection made incrementally with aspirations every 5 mL.  Performed by: Personally  Anesthesiologist: Corinda Gubler, MD  Additional Notes: Patient's chart reviewed and they were deemed appropriate candidate for procedure, per surgeon's request. Patient educated about risks, benefits, and alternatives of the block including but not limited to: temporary or permanent nerve damage, bleeding, infection, damage to surround tissues, block failure, local anesthetic toxicity. Patient expressed understanding. A formal time-out was conducted consistent with institution rules.  Monitors were applied, and minimal sedation used (see nursing record). The site was prepped with skin prep and allowed to dry, and sterile gloves were used. A high frequency linear ultrasound probe with probe cover was utilized throughout. Femoral artery visualized at mid-thigh level, local anesthetic injected anterolateral to it, and echogenic block needle trajectory was monitored throughout. Hydrodissection of saphenous nerve visualized and appeared anatomically normal. Aspiration performed every 5ml. Blood vessels were avoided. All injections were performed without resistance and free of blood and paresthesias. The patient tolerated the procedure well.  Injectate: 10ml exparel (surgeon request) + 10ml 0.5% bupivacaine

## 2022-12-08 NOTE — Transfer of Care (Signed)
Immediate Anesthesia Transfer of Care Note  Patient: Eduardo Ball  Procedure(s) Performed: OPEN REDUCTION INTERNAL FIXATION (ORIF) ANKLE FRACTURE (Right: Ankle)  Patient Location: PACU  Anesthesia Type:General  Level of Consciousness: awake  Airway & Oxygen Therapy: Patient Spontanous Breathing and Patient connected to face mask oxygen  Post-op Assessment: Report given to RN and Post -op Vital signs reviewed and stable  Post vital signs: Reviewed and stable  Last Vitals:  Vitals Value Taken Time  BP 121/60   Temp 36.1 C 12/08/22 1554  Pulse 50   Resp 18 12/08/22 1554  SpO2 100     Last Pain:  Vitals:   12/08/22 1138  TempSrc: Oral  PainSc: 8          Complications: No notable events documented.

## 2022-12-08 NOTE — Anesthesia Preprocedure Evaluation (Signed)
Anesthesia Evaluation  Patient identified by MRN, date of birth, ID band Patient awake    Reviewed: Allergy & Precautions, NPO status , Patient's Chart, lab work & pertinent test results  History of Anesthesia Complications Negative for: history of anesthetic complications  Airway Mallampati: I  TM Distance: >3 FB Neck ROM: Full    Dental no notable dental hx. (+) Teeth Intact   Pulmonary neg pulmonary ROS, neg sleep apnea, neg COPD, Patient abstained from smoking.Not current smoker   Pulmonary exam normal breath sounds clear to auscultation       Cardiovascular Exercise Tolerance: Good METS(-) hypertension(-) CAD and (-) Past MI negative cardio ROS (-) dysrhythmias  Rhythm:Regular Rate:Normal - Systolic murmurs    Neuro/Psych negative neurological ROS  negative psych ROS   GI/Hepatic ,neg GERD  ,,(+)     substance abuse  alcohol use and marijuana use  Endo/Other  neg diabetes    Renal/GU negative Renal ROS     Musculoskeletal   Abdominal   Peds  Hematology   Anesthesia Other Findings Past Medical History: No date: ADHD (attention deficit hyperactivity disorder)  Reproductive/Obstetrics                             Anesthesia Physical Anesthesia Plan  ASA: 1  Anesthesia Plan: General   Post-op Pain Management: Regional block* and Ofirmev IV (intra-op)*   Induction: Intravenous  PONV Risk Score and Plan: 2 and Ondansetron, Dexamethasone, Midazolam, TIVA and Propofol infusion  Airway Management Planned: Oral ETT and Video Laryngoscope Planned  Additional Equipment: None  Intra-op Plan:   Post-operative Plan: Extubation in OR  Informed Consent: I have reviewed the patients History and Physical, chart, labs and discussed the procedure including the risks, benefits and alternatives for the proposed anesthesia with the patient or authorized representative who has indicated  his/her understanding and acceptance.     Dental advisory given  Plan Discussed with: CRNA and Surgeon  Anesthesia Plan Comments: (Discussed risks of anesthesia with patient, including PONV, sore throat, lip/dental/eye damage. Rare risks discussed as well, such as cardiorespiratory and neurological sequelae, and allergic reactions. Discussed the role of CRNA in patient's perioperative care. Patient understands.  Discussed r/b/a of adductor canal and popliteal nerve blocks, including:  - bleeding, infection, nerve damage - poor or non functioning block. - reactions and toxicity to local anesthetic Patient understands. )       Anesthesia Quick Evaluation

## 2022-12-08 NOTE — Anesthesia Procedure Notes (Signed)
Anesthesia Regional Block: Popliteal block   Pre-Anesthetic Checklist: , timeout performed,  Correct Patient, Correct Site, Correct Laterality,  Correct Procedure, Correct Position, site marked,  Risks and benefits discussed,  Surgical consent,  Pre-op evaluation,  At surgeon's request and post-op pain management  Laterality: Lower and Right  Prep: chloraprep       Needles:  Injection technique: Single-shot  Needle Type: Echogenic Needle     Needle Length: 9cm  Needle Gauge: 21     Additional Needles:   Procedures:,,,, ultrasound used (permanent image in chart),,    Narrative:  Injection made incrementally with aspirations every 5 mL.  Performed by: Personally  Anesthesiologist: Corinda Gubler, MD  Additional Notes: Patient's chart reviewed and they were deemed appropriate candidate for procedure, at surgeon's request. Patient educated about risks, benefits, and alternatives of the block including but not limited to: temporary or permanent nerve damage, bleeding, infection, damage to surround tissues, block failure, local anesthetic toxicity. Patient expressed understanding. A formal time-out was conducted consistent with institution rules.  Monitors were applied, and minimal sedation used. The site was prepped with skin prep and allowed to dry, and sterile gloves were used. A high frequency linear ultrasound probe with probe cover was utilized throughout. Popliteal artery pulsatile and visualized in popliteal fossa along with adjacent sciatic nerve and its branch point, which appeared anatomically normal, local anesthetic injected around them just proximal to the branch point, and echogenic block needle trajectory was monitored throughout. Aspiration performed every 5ml. Blood vessels were avoided. All injections were performed without resistance and free of blood and paresthesias. The patient tolerated the procedure well.  Injectate: 10ml exparel (surgeon request) + 10ml 0.5%  bupivacaine

## 2022-12-08 NOTE — Anesthesia Postprocedure Evaluation (Signed)
Anesthesia Post Note  Patient: Eduardo Ball  Procedure(s) Performed: OPEN REDUCTION INTERNAL FIXATION (ORIF) ANKLE FRACTURE (Right: Ankle)  Patient location during evaluation: PACU Anesthesia Type: General Level of consciousness: awake and alert Pain management: pain level controlled Vital Signs Assessment: post-procedure vital signs reviewed and stable Respiratory status: spontaneous breathing, nonlabored ventilation, respiratory function stable and patient connected to nasal cannula oxygen Cardiovascular status: blood pressure returned to baseline and stable Postop Assessment: no apparent nausea or vomiting Anesthetic complications: no   No notable events documented.   Last Vitals:  Vitals:   12/08/22 1615 12/08/22 1630  BP: 123/74 114/78  Pulse: 81 68  Resp: 11 12  Temp: 36.4 C (!) 36.3 C  SpO2: 99% 100%    Last Pain:  Vitals:   12/08/22 1630  TempSrc: Temporal  PainSc: 0-No pain                 Corinda Gubler

## 2022-12-10 ENCOUNTER — Encounter: Payer: Self-pay | Admitting: Podiatry

## 2022-12-18 ENCOUNTER — Encounter: Payer: Self-pay | Admitting: Podiatry

## 2023-08-07 ENCOUNTER — Ambulatory Visit (INDEPENDENT_AMBULATORY_CARE_PROVIDER_SITE_OTHER): Payer: No Typology Code available for payment source

## 2023-08-07 ENCOUNTER — Ambulatory Visit
Admission: EM | Admit: 2023-08-07 | Discharge: 2023-08-07 | Disposition: A | Discharge status: Home / Self Care | Payer: No Typology Code available for payment source | Attending: Family Medicine | Admitting: Family Medicine

## 2023-08-07 DIAGNOSIS — M25571 Pain in right ankle and joints of right foot: Secondary | ICD-10-CM | POA: Diagnosis not present

## 2023-08-07 MED ORDER — NAPROXEN 500 MG PO TABS: 500.0000 mg | ORAL_TABLET | Freq: Two times a day (BID) | ORAL | 0 refills | Status: AC

## 2023-08-07 NOTE — ED Triage Notes (Addendum)
 Pt c/o R ankle swelling & pain since having surgery on 12/10/22. States has been getting worse within the past 2 wks. Has reached out to surgeon today but was unable to reach anyone. Has tried APAP & IBU w/o relief. Denies any fall or injuries.

## 2023-08-07 NOTE — Discharge Instructions (Addendum)
 If medication was prescribed, stop by the pharmacy to pick up your prescriptions.  For your  pain, Take 1500 mg Tylenol twice a day, take Naprosyn twice a day,  as needed for pain. Wear your ankle brace with all activity.  Rest and elevate the affected painful area.  Apply warm compresses intermittently, as needed.  As pain recedes, begin normal activities slowly as tolerated.  Follow up with Dr Ether Griffins.   Watch for worsening symptoms such as an increasing weakness or loss of sensation, increasing pain and/or the loss of bladder or bowel function. Should any of these occur, go to the emergency department immediately.
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
## 2024-02-25 ENCOUNTER — Other Ambulatory Visit: Payer: Self-pay | Admitting: Podiatry

## 2024-02-25 ENCOUNTER — Encounter: Payer: Self-pay | Admitting: Podiatry

## 2024-02-25 DIAGNOSIS — M65979 Unspecified synovitis and tenosynovitis, unspecified ankle and foot: Secondary | ICD-10-CM

## 2024-03-03 ENCOUNTER — Ambulatory Visit
Admission: RE | Admit: 2024-03-03 | Discharge: 2024-03-03 | Disposition: A | Discharge status: Home / Self Care | Source: Ambulatory Visit | Attending: Podiatry | Admitting: Podiatry

## 2024-03-03 DIAGNOSIS — M65979 Unspecified synovitis and tenosynovitis, unspecified ankle and foot: Secondary | ICD-10-CM
